# Patient Record
Sex: Female | Born: 1950 | Race: Black or African American | Hispanic: No | Smoking: Never smoker
Health system: Southern US, Community
[De-identification: ages and names within clinical notes are randomized; demographics above are authoritative.]

## PROBLEM LIST (undated history)

## (undated) DIAGNOSIS — I10 Essential (primary) hypertension: Secondary | ICD-10-CM

## (undated) DIAGNOSIS — E785 Hyperlipidemia, unspecified: Secondary | ICD-10-CM

## (undated) DIAGNOSIS — Z973 Presence of spectacles and contact lenses: Secondary | ICD-10-CM

## (undated) HISTORY — PX: COLONOSCOPY: SHX174

## (undated) HISTORY — PX: ABDOMINAL HYSTERECTOMY: SHX81

---

## 2005-07-23 ENCOUNTER — Emergency Department (HOSPITAL_COMMUNITY): Admission: EM | Admit: 2005-07-23 | Discharge: 2005-07-23 | Payer: Self-pay | Admitting: Emergency Medicine

## 2007-10-08 ENCOUNTER — Emergency Department (HOSPITAL_COMMUNITY): Admission: EM | Admit: 2007-10-08 | Discharge: 2007-10-09 | Payer: Self-pay | Admitting: Emergency Medicine

## 2010-11-27 ENCOUNTER — Emergency Department (HOSPITAL_BASED_OUTPATIENT_CLINIC_OR_DEPARTMENT_OTHER)
Admission: EM | Admit: 2010-11-27 | Discharge: 2010-11-27 | Payer: Self-pay | Source: Home / Self Care | Admitting: Emergency Medicine

## 2011-02-27 LAB — DIFFERENTIAL
Basophils Absolute: 0 10*3/uL (ref 0.0–0.1)
Basophils Relative: 0 % (ref 0–1)
Eosinophils Absolute: 0 10*3/uL (ref 0.0–0.7)
Monocytes Relative: 5 % (ref 3–12)
Neutro Abs: 8.1 10*3/uL — ABNORMAL HIGH (ref 1.7–7.7)
Neutrophils Relative %: 84 % — ABNORMAL HIGH (ref 43–77)

## 2011-02-27 LAB — LIPASE, BLOOD: Lipase: 75 U/L (ref 23–300)

## 2011-02-27 LAB — POCT CARDIAC MARKERS: Myoglobin, poc: 83.5 ng/mL (ref 12–200)

## 2011-02-27 LAB — COMPREHENSIVE METABOLIC PANEL
ALT: 17 U/L (ref 0–35)
Alkaline Phosphatase: 98 U/L (ref 39–117)
BUN: 11 mg/dL (ref 6–23)
CO2: 23 mEq/L (ref 19–32)
GFR calc non Af Amer: >60 mL/min (ref >60)
Glucose, Bld: 171 mg/dL — ABNORMAL HIGH (ref 70–99)
Potassium: 3.9 mEq/L (ref 3.5–5.1)
Total Bilirubin: 0.8 mg/dL (ref 0.3–1.2)
Total Protein: 8.1 g/dL (ref 6.0–8.3)

## 2011-02-27 LAB — CBC
HCT: 39.7 % (ref 36.0–46.0)
Hemoglobin: 14.1 g/dL (ref 12.0–15.0)
MCV: 88 fL (ref 78.0–100.0)
RDW: 13.1 % (ref 11.5–15.5)
WBC: 9.7 10*3/uL (ref 4.0–10.5)

## 2015-03-08 ENCOUNTER — Ambulatory Visit: Payer: Self-pay | Admitting: General Surgery

## 2015-03-31 ENCOUNTER — Encounter (HOSPITAL_BASED_OUTPATIENT_CLINIC_OR_DEPARTMENT_OTHER): Payer: Self-pay | Admitting: *Deleted

## 2015-03-31 NOTE — Progress Notes (Signed)
To come in for CCS cxr,ekg, cbc cmet

## 2015-04-04 ENCOUNTER — Other Ambulatory Visit: Payer: Self-pay | Admitting: *Deleted

## 2015-04-04 ENCOUNTER — Ambulatory Visit
Admission: RE | Admit: 2015-04-04 | Discharge: 2015-04-04 | Disposition: A | Discharge status: Home / Self Care | Payer: BLUE CROSS/BLUE SHIELD | Source: Ambulatory Visit | Attending: General Surgery | Admitting: General Surgery

## 2015-04-04 ENCOUNTER — Other Ambulatory Visit: Payer: Self-pay

## 2015-04-04 ENCOUNTER — Encounter (HOSPITAL_BASED_OUTPATIENT_CLINIC_OR_DEPARTMENT_OTHER)
Admission: RE | Admit: 2015-04-04 | Discharge: 2015-04-04 | Disposition: A | Discharge status: Home / Self Care | Payer: BLUE CROSS/BLUE SHIELD | Source: Ambulatory Visit | Attending: General Surgery | Admitting: General Surgery

## 2015-04-04 DIAGNOSIS — K219 Gastro-esophageal reflux disease without esophagitis: Secondary | ICD-10-CM | POA: Diagnosis not present

## 2015-04-04 DIAGNOSIS — Z01818 Encounter for other preprocedural examination: Secondary | ICD-10-CM | POA: Insufficient documentation

## 2015-04-04 DIAGNOSIS — I10 Essential (primary) hypertension: Secondary | ICD-10-CM | POA: Diagnosis not present

## 2015-04-04 DIAGNOSIS — K8020 Calculus of gallbladder without cholecystitis without obstruction: Secondary | ICD-10-CM | POA: Insufficient documentation

## 2015-04-04 DIAGNOSIS — K8010 Calculus of gallbladder with chronic cholecystitis without obstruction: Secondary | ICD-10-CM | POA: Diagnosis not present

## 2015-04-04 LAB — COMPREHENSIVE METABOLIC PANEL
ALBUMIN: 3.8 g/dL (ref 3.5–5.2)
ALT: 12 U/L (ref 0–35)
AST: 20 U/L (ref 0–37)
Alkaline Phosphatase: 66 U/L (ref 39–117)
Anion gap: 10 (ref 5–15)
BILIRUBIN TOTAL: 0.9 mg/dL (ref 0.3–1.2)
BUN: 7 mg/dL (ref 6–23)
CHLORIDE: 100 mmol/L (ref 96–112)
CO2: 28 mmol/L (ref 19–32)
CREATININE: 0.95 mg/dL (ref 0.50–1.10)
Calcium: 9.1 mg/dL (ref 8.4–10.5)
GFR calc Af Amer: 72 mL/min — ABNORMAL LOW (ref >90)
GFR calc non Af Amer: 62 mL/min — ABNORMAL LOW (ref >90)
Glucose, Bld: 152 mg/dL — ABNORMAL HIGH (ref 70–99)
POTASSIUM: 4.2 mmol/L (ref 3.5–5.1)
SODIUM: 138 mmol/L (ref 135–145)
Total Protein: 7.2 g/dL (ref 6.0–8.3)

## 2015-04-04 LAB — CBC WITH DIFFERENTIAL/PLATELET
Basophils Absolute: 0 10*3/uL (ref 0.0–0.1)
Basophils Relative: 0 % (ref 0–1)
EOS ABS: 0.1 10*3/uL (ref 0.0–0.7)
Eosinophils Relative: 2 % (ref 0–5)
HCT: 42 % (ref 36.0–46.0)
Hemoglobin: 14.3 g/dL (ref 12.0–15.0)
LYMPHS ABS: 2 10*3/uL (ref 0.7–4.0)
Lymphocytes Relative: 29 % (ref 12–46)
MCH: 32.4 pg (ref 26.0–34.0)
MCHC: 34 g/dL (ref 30.0–36.0)
MCV: 95 fL (ref 78.0–100.0)
MONOS PCT: 13 % — AB (ref 3–12)
Monocytes Absolute: 0.9 10*3/uL (ref 0.1–1.0)
NEUTROS PCT: 56 % (ref 43–77)
Neutro Abs: 3.7 10*3/uL (ref 1.7–7.7)
Platelets: 387 10*3/uL (ref 150–400)
RBC: 4.42 MIL/uL (ref 3.87–5.11)
RDW: 13.7 % (ref 11.5–15.5)
WBC: 6.7 10*3/uL (ref 4.0–10.5)

## 2015-04-04 NOTE — Progress Notes (Signed)
Pt here for labs/EKG. Reviewed EKG with Dr. Ivin Bootyrews. Pt forgot her list of medicines she had been asked to bring when she came for labs. Reiterated importance of bringing meds with her on DOS for review. Sent pt for CXR as ordered.

## 2015-04-06 NOTE — H&P (Signed)
Judy Castro. Amedee 03/08/2015 8:43 AM Location: [facility] Patient #: [id number] DOB: 12/14/1951 Divorced / Language: [place] / Race: Black or African American Female  History of Present Illness Judy Castro CMA; 03/08/2015 8:44 AM) Patient words: gallbladder.  The patient is a 64 year old female    Other Problems Judy Castro, CMA; 03/08/2015 8:43 AM) Gastroesophageal Reflux Disease High blood pressure  Past Surgical History Judy Castro, CMA; 03/08/2015 8:43 AM) Hysterectomy (not due to cancer) - Partial  Diagnostic Studies History Judy Castro, CMA; 03/08/2015 8:43 AM) Colonoscopy >10 years ago Mammogram within last year Pap Smear 1-5 years ago  Allergies Judy Castro, CMA; 03/08/2015 8:44 AM) No Known Drug Allergies03/22/2016  Medication History (Judy Castro, CMA; 03/08/2015 8:45 AM) Lisinopril (  Tablet, Oral) Active. Atenolol (  Tablet, Oral) Active. Diclofenac Potassium (  Tablet, Oral) Active. Estradiol (0.05MG /24HR Patch TW, Transdermal) Active. Simvastatin (  Tablet, Oral) Active. Medications Reconciled  Social History Judy Castro, CMA; 03/08/2015 8:43 AM) Caffeine use Tea. No alcohol use No drug use Tobacco use Never smoker.  Family History Judy Castro, CMA; 03/08/2015 8:43 AM) Alcohol Abuse Father. Arthritis Father. Diabetes Mellitus Mother, Sister. Heart Disease Brother, Mother, Sister. Heart disease in female family member before age 16 Hypertension Brother, Father, Mother, Sister. Respiratory Condition Brother, Mother.  Pregnancy / Birth History Judy Castro, CMA; 03/08/2015 8:43 AM) Age at menarche 12 years. Gravida 3 Maternal age 63-25 Para 3  Review of Systems Judy Castro. Judy Spruce MD; 03/08/2015 9:24 AM) General Present- Night Sweats and Weight Gain. Not Present- Appetite Loss, Chills, Fatigue, Fever and Weight Loss. Skin Not Present- Change in Wart/Mole, Dryness, Hives, Jaundice, New Lesions, Non-Healing  Wounds, Rash and Ulcer. HEENT Present- Wears glasses/contact lenses. Not Present- Earache, Hearing Loss, Hoarseness, Nose Bleed, Oral Ulcers, Ringing in the Ears, Seasonal Allergies, Sinus Pain, Sore Throat, Visual Disturbances and Yellow Eyes. Respiratory Present- Snoring. Not Present- Bloody sputum, Chronic Cough, Difficulty Breathing and Wheezing. Breast Not Present- Breast Mass, Breast Pain, Nipple Discharge and Skin Changes. Gastrointestinal Present- Bloating, Excessive gas, Gas, Heartburn and Indigestion. Not Present- Abdominal Pain, Bloody Stool, Change in Bowel Habits, Chronic diarrhea, Constipation, Difficulty Swallowing, Gets full quickly at meals, Hemorrhoids, Nausea, Rectal Pain and Vomiting. Female Genitourinary Present- Urgency. Not Present- Frequency, Nocturia, Painful Urination and Pelvic Pain. Musculoskeletal Present- Joint Stiffness. Not Present- Back Pain, Joint Pain, Muscle Pain, Muscle Weakness and Swelling of Extremities. Neurological Not Present- Decreased Memory, Fainting, Headaches, Numbness, Seizures, Tingling, Tremor, Trouble walking and Weakness. Psychiatric Not Present- Anxiety, Bipolar, Change in Sleep Pattern, Depression, Fearful and Frequent crying. Endocrine Present- Hot flashes. Not Present- Cold Intolerance, Excessive Hunger, Hair Changes, Heat Intolerance and New Diabetes. Hematology Not Present- Easy Bruising, Excessive bleeding, Gland problems, HIV and Persistent Infections.   Vitals (Judy Castro CMA; 03/08/2015 8:44 AM) 03/08/2015 8:44 AM Weight: 212 lb Height: 63in Body Surface Area: 2.07 m Body Mass Index: 37.55 kg/m Temp.: 97.48F(Temporal)  Pulse: 78 (Regular)  BP: 124/76 (Sitting, Left Arm, Standard)    Physical Exam (Judy Castro O. Judy Spruce MD; 03/08/2015 9:26 AM) General Mental Status-Alert. General Appearance-Well groomed and Consistent with stated age.  Chest and Lung Exam Chest and lung exam reveals -normal excursion with symmetric  chest walls, quiet, even and easy respiratory effort with no use of accessory muscles, non-tender and normal tactile fremitus and on auscultation, normal breath sounds, no adventitious sounds and normal vocal resonance.  Cardiovascular Cardiovascular examination reveals -on palpation PMI is normal in location and amplitude, no palpable S3 or S4. Normal cardiac borders. and (  see Vital Signs section for blood pressure measurements). Auscultation Rhythm - Regular. Heart Sounds - Normal heart sounds.  Abdomen Inspection Inspection of the abdomen reveals - No Visible peristalsis. Palpation/Percussion Tenderness - Epigastrium and Right Upper Quadrant. Note: Mild discomfort. Liver - Upper border - 4th intercostal space. Auscultation Auscultation of the abdomen reveals - Bowel sounds normal.    Assessment & Plan Judy Castro O. Judy Reinheimer MD; 03/08/2015 9:28 AM) SYMPTOMATIC CHOLELITHIASIS (574.20  K80.20) Story: Patient with recurrrent symptoms over the past two years. recent episode 3 weeks ago, ultrasound demonstrated cholelithiasis Impression: Symptomatic cholelithiasis, biliary colic. Will schedule for surgery based on the patient's findings clinically and radiologically.     Signed by Judy RidgesJames O Tanayah Squitieri, MD (03/08/2015 9:30 AM)

## 2015-04-07 ENCOUNTER — Ambulatory Visit (HOSPITAL_BASED_OUTPATIENT_CLINIC_OR_DEPARTMENT_OTHER): Payer: BLUE CROSS/BLUE SHIELD | Admitting: Certified Registered"

## 2015-04-07 ENCOUNTER — Encounter (HOSPITAL_BASED_OUTPATIENT_CLINIC_OR_DEPARTMENT_OTHER): Payer: Self-pay | Admitting: *Deleted

## 2015-04-07 ENCOUNTER — Ambulatory Visit (HOSPITAL_BASED_OUTPATIENT_CLINIC_OR_DEPARTMENT_OTHER)
Admission: RE | Admit: 2015-04-07 | Discharge: 2015-04-08 | Disposition: A | Discharge status: Home / Self Care | Payer: BLUE CROSS/BLUE SHIELD | Source: Ambulatory Visit | Attending: General Surgery | Admitting: General Surgery

## 2015-04-07 ENCOUNTER — Encounter (HOSPITAL_BASED_OUTPATIENT_CLINIC_OR_DEPARTMENT_OTHER)
Admission: RE | Disposition: A | Discharge status: Home / Self Care | Payer: Self-pay | Source: Ambulatory Visit | Attending: General Surgery

## 2015-04-07 ENCOUNTER — Ambulatory Visit (HOSPITAL_COMMUNITY): Payer: BLUE CROSS/BLUE SHIELD

## 2015-04-07 DIAGNOSIS — K8010 Calculus of gallbladder with chronic cholecystitis without obstruction: Secondary | ICD-10-CM | POA: Insufficient documentation

## 2015-04-07 DIAGNOSIS — I10 Essential (primary) hypertension: Secondary | ICD-10-CM | POA: Insufficient documentation

## 2015-04-07 DIAGNOSIS — K8063 Calculus of gallbladder and bile duct with acute cholecystitis with obstruction: Secondary | ICD-10-CM

## 2015-04-07 DIAGNOSIS — K219 Gastro-esophageal reflux disease without esophagitis: Secondary | ICD-10-CM | POA: Insufficient documentation

## 2015-04-07 DIAGNOSIS — Z9049 Acquired absence of other specified parts of digestive tract: Secondary | ICD-10-CM

## 2015-04-07 HISTORY — DX: Essential (primary) hypertension: I10

## 2015-04-07 HISTORY — PX: CHOLECYSTECTOMY: SHX55

## 2015-04-07 HISTORY — DX: Hyperlipidemia, unspecified: E78.5

## 2015-04-07 HISTORY — DX: Presence of spectacles and contact lenses: Z97.3

## 2015-04-07 LAB — GLUCOSE, CAPILLARY: Glucose-Capillary: 113 mg/dL — ABNORMAL HIGH (ref 70–99)

## 2015-04-07 SURGERY — LAPAROSCOPIC CHOLECYSTECTOMY WITH INTRAOPERATIVE CHOLANGIOGRAM
Anesthesia: General | Site: Abdomen

## 2015-04-07 MED ORDER — CHLORHEXIDINE GLUCONATE 4 % EX LIQD: 1.0000 "application " | Freq: Once | CUTANEOUS | Status: DC

## 2015-04-07 MED ORDER — HYDROMORPHONE HCL 1 MG/ML IJ SOLN: 0.5000 mg | INTRAMUSCULAR | Status: DC | PRN

## 2015-04-07 MED ORDER — HYDROMORPHONE HCL 1 MG/ML IJ SOLN
INTRAMUSCULAR | Status: AC
  Filled 2015-04-07: qty 1

## 2015-04-07 MED ORDER — ESTRADIOL 1 MG PO TABS: 1.0000 mg | ORAL_TABLET | Freq: Every day | ORAL | Status: DC

## 2015-04-07 MED ORDER — HYDROMORPHONE HCL 1 MG/ML IJ SOLN
0.2500 mg | INTRAMUSCULAR | Status: DC | PRN
  Administered 2015-04-07 (×4): 0.5 mg via INTRAVENOUS

## 2015-04-07 MED ORDER — ATENOLOL 25 MG PO TABS: 25.0000 mg | ORAL_TABLET | Freq: Every day | ORAL | Status: DC

## 2015-04-07 MED ORDER — IBUPROFEN 200 MG PO TABS: 200.0000 mg | ORAL_TABLET | Freq: Four times a day (QID) | ORAL | Status: DC | PRN

## 2015-04-07 MED ORDER — SCOPOLAMINE 1 MG/3DAYS TD PT72
1.0000 | MEDICATED_PATCH | TRANSDERMAL | Status: DC
  Administered 2015-04-07: 1.5 mg via TRANSDERMAL
  Administered 2015-04-07: 1 via TRANSDERMAL

## 2015-04-07 MED ORDER — FENTANYL CITRATE 0.05 MG/ML IJ SOLN
50.0000 ug | INTRAMUSCULAR | Status: DC | PRN
  Administered 2015-04-07: 50 ug via INTRAVENOUS
  Administered 2015-04-07: 100 ug via INTRAVENOUS

## 2015-04-07 MED ORDER — PROPOFOL 10 MG/ML IV BOLUS
INTRAVENOUS | Status: DC | PRN
  Administered 2015-04-07: 200 mg via INTRAVENOUS

## 2015-04-07 MED ORDER — LISINOPRIL 20 MG PO TABS: 20.0000 mg | ORAL_TABLET | Freq: Every day | ORAL | Status: DC

## 2015-04-07 MED ORDER — OXYCODONE HCL 5 MG PO TABS: 5.0000 mg | ORAL_TABLET | Freq: Once | ORAL | Status: DC | PRN

## 2015-04-07 MED ORDER — DEXAMETHASONE SODIUM PHOSPHATE 4 MG/ML IJ SOLN
INTRAMUSCULAR | Status: DC | PRN
  Administered 2015-04-07: 10 mg via INTRAVENOUS

## 2015-04-07 MED ORDER — LIDOCAINE HCL (CARDIAC) 20 MG/ML IV SOLN
INTRAVENOUS | Status: DC | PRN
  Administered 2015-04-07: 50 mg via INTRAVENOUS

## 2015-04-07 MED ORDER — KETOROLAC TROMETHAMINE 30 MG/ML IJ SOLN: 30.0000 mg | Freq: Once | INTRAMUSCULAR | Status: DC | PRN

## 2015-04-07 MED ORDER — BUPIVACAINE-EPINEPHRINE 0.5% -1:200000 IJ SOLN
INTRAMUSCULAR | Status: DC | PRN
  Administered 2015-04-07: 10 mL

## 2015-04-07 MED ORDER — IBUPROFEN 100 MG/5ML PO SUSP: 200.0000 mg | Freq: Four times a day (QID) | ORAL | Status: DC | PRN

## 2015-04-07 MED ORDER — NEOSTIGMINE METHYLSULFATE 10 MG/10ML IV SOLN
INTRAVENOUS | Status: DC | PRN
  Administered 2015-04-07: 3 mg via INTRAVENOUS

## 2015-04-07 MED ORDER — OXYCODONE-ACETAMINOPHEN 10-325 MG PO TABS: 1.0000 | ORAL_TABLET | ORAL | Status: AC | PRN

## 2015-04-07 MED ORDER — PROPOFOL 500 MG/50ML IV EMUL
INTRAVENOUS | Status: AC
Start: 2015-04-07 — End: 2015-04-07
  Filled 2015-04-07: qty 50

## 2015-04-07 MED ORDER — DEXTROSE 5 % IV SOLN
2.0000 g | INTRAVENOUS | Status: AC
  Administered 2015-04-07: 2 g via INTRAVENOUS

## 2015-04-07 MED ORDER — SCOPOLAMINE 1 MG/3DAYS TD PT72
MEDICATED_PATCH | TRANSDERMAL | Status: AC
  Filled 2015-04-07: qty 1

## 2015-04-07 MED ORDER — MIDAZOLAM HCL 2 MG/2ML IJ SOLN
1.0000 mg | INTRAMUSCULAR | Status: DC | PRN
  Administered 2015-04-07: 2 mg via INTRAVENOUS

## 2015-04-07 MED ORDER — MEPERIDINE HCL 25 MG/ML IJ SOLN: 6.2500 mg | INTRAMUSCULAR | Status: DC | PRN

## 2015-04-07 MED ORDER — FENTANYL CITRATE (PF) 100 MCG/2ML IJ SOLN
INTRAMUSCULAR | Status: AC
  Filled 2015-04-07: qty 4

## 2015-04-07 MED ORDER — OXYCODONE HCL 5 MG/5ML PO SOLN: 5.0000 mg | Freq: Once | ORAL | Status: DC | PRN

## 2015-04-07 MED ORDER — MIDAZOLAM HCL 2 MG/2ML IJ SOLN
INTRAMUSCULAR | Status: AC
  Filled 2015-04-07: qty 2

## 2015-04-07 MED ORDER — KCL IN DEXTROSE-NACL 10-5-0.45 MEQ/L-%-% IV SOLN
INTRAVENOUS | Status: DC
  Administered 2015-04-07: 18:00:00 via INTRAVENOUS

## 2015-04-07 MED ORDER — ROCURONIUM BROMIDE 100 MG/10ML IV SOLN
INTRAVENOUS | Status: DC | PRN
  Administered 2015-04-07: 5 mg via INTRAVENOUS
  Administered 2015-04-07: 35 mg via INTRAVENOUS

## 2015-04-07 MED ORDER — GLYCOPYRROLATE 0.2 MG/ML IJ SOLN
INTRAMUSCULAR | Status: DC | PRN
  Administered 2015-04-07: 0.4 mg via INTRAVENOUS

## 2015-04-07 MED ORDER — OXYCODONE-ACETAMINOPHEN 5-325 MG PO TABS
1.0000 | ORAL_TABLET | ORAL | Status: DC | PRN
  Administered 2015-04-07 – 2015-04-08 (×2): 2 via ORAL
  Filled 2015-04-07 (×2): qty 2

## 2015-04-07 MED ORDER — HYDROMORPHONE HCL 1 MG/ML IJ SOLN
0.5000 mg | INTRAMUSCULAR | Status: DC | PRN
  Administered 2015-04-07 (×2): 0.5 mg via INTRAVENOUS

## 2015-04-07 MED ORDER — FENTANYL CITRATE (PF) 100 MCG/2ML IJ SOLN
INTRAMUSCULAR | Status: AC
Start: 2015-04-07 — End: 2015-04-07
  Filled 2015-04-07: qty 2

## 2015-04-07 MED ORDER — SODIUM CHLORIDE 0.9 % IV SOLN
INTRAVENOUS | Status: DC | PRN
  Administered 2015-04-07: 12 mL

## 2015-04-07 MED ORDER — SODIUM CHLORIDE 0.9 % IR SOLN
Status: DC | PRN
  Administered 2015-04-07: 2400 mL

## 2015-04-07 MED ORDER — LACTATED RINGERS IV SOLN
INTRAVENOUS | Status: DC
  Administered 2015-04-07 (×3): via INTRAVENOUS

## 2015-04-07 MED ORDER — DEXTROSE 5 % IV SOLN
INTRAVENOUS | Status: AC
  Filled 2015-04-07 (×2): qty 1

## 2015-04-07 SURGICAL SUPPLY — 55 items
APPLIER CLIP 5 13 M/L LIGAMAX5 (MISCELLANEOUS) ×3
APPLIER CLIP ROT 10 11.4 M/L (STAPLE)
APR CLP MED LRG 5 ANG JAW (MISCELLANEOUS) ×1
BLADE CLIPPER SURG (BLADE) IMPLANT
CHLORAPREP W/TINT 26ML (MISCELLANEOUS) ×3 IMPLANT
CLEANER CAUTERY TIP 5X5 PAD (MISCELLANEOUS) ×1 IMPLANT
CLIP APPLIE 5 13 M/L LIGAMAX5 (MISCELLANEOUS) ×1 IMPLANT
CLIP APPLIE ROT 10 11.4 M/L (STAPLE) IMPLANT
CLOSURE WOUND 1/2 X4 (GAUZE/BANDAGES/DRESSINGS)
COVER MAYO STAND STRL (DRAPES) ×6 IMPLANT
DECANTER SPIKE VIAL GLASS SM (MISCELLANEOUS) IMPLANT
DRAPE C-ARM 42X72 X-RAY (DRAPES) ×3 IMPLANT
DRAPE LAPAROSCOPIC ABDOMINAL (DRAPES) ×3 IMPLANT
DRSG TEGADERM 2-3/8X2-3/4 SM (GAUZE/BANDAGES/DRESSINGS) ×12 IMPLANT
ELECT REM PT RETURN 9FT ADLT (ELECTROSURGICAL) ×3
ELECTRODE REM PT RTRN 9FT ADLT (ELECTROSURGICAL) ×1 IMPLANT
ENDOLOOP SUT PDS II  0 18 (SUTURE) ×2
ENDOLOOP SUT PDS II 0 18 (SUTURE) ×1 IMPLANT
FILTER SMOKE EVAC LAPAROSHD (FILTER) ×3 IMPLANT
GLOVE BIO SURGEON STRL SZ7 (GLOVE) ×3 IMPLANT
GLOVE BIO SURGEON STRL SZ7.5 (GLOVE) ×3 IMPLANT
GLOVE BIOGEL PI IND STRL 7.0 (GLOVE) ×1 IMPLANT
GLOVE BIOGEL PI IND STRL 8 (GLOVE) ×2 IMPLANT
GLOVE BIOGEL PI INDICATOR 7.0 (GLOVE) ×2
GLOVE BIOGEL PI INDICATOR 8 (GLOVE) ×4
GLOVE ECLIPSE 7.5 STRL STRAW (GLOVE) ×3 IMPLANT
GLOVE EXAM NITRILE EXT CUFF MD (GLOVE) ×3 IMPLANT
GLOVE SURG SS PI 7.0 STRL IVOR (GLOVE) ×3 IMPLANT
GOWN STRL REUS W/ TWL LRG LVL3 (GOWN DISPOSABLE) ×3 IMPLANT
GOWN STRL REUS W/ TWL XL LVL3 (GOWN DISPOSABLE) ×1 IMPLANT
GOWN STRL REUS W/TWL LRG LVL3 (GOWN DISPOSABLE) ×6
GOWN STRL REUS W/TWL XL LVL3 (GOWN DISPOSABLE) ×2
HEMOSTAT SNOW SURGICEL 2X4 (HEMOSTASIS) IMPLANT
LIQUID BAND (GAUZE/BANDAGES/DRESSINGS) ×3 IMPLANT
NS IRRIG 1000ML POUR BTL (IV SOLUTION) IMPLANT
PACK BASIN DAY SURGERY FS (CUSTOM PROCEDURE TRAY) ×3 IMPLANT
PAD CLEANER CAUTERY TIP 5X5 (MISCELLANEOUS) ×2
POUCH SPECIMEN RETRIEVAL 10MM (ENDOMECHANICALS) ×3 IMPLANT
SCISSORS LAP 5X35 DISP (ENDOMECHANICALS) IMPLANT
SET CHOLANGIOGRAPH 5 50 .035 (SET/KITS/TRAYS/PACK) ×3 IMPLANT
SET IRRIG TUBING LAPAROSCOPIC (IRRIGATION / IRRIGATOR) ×3 IMPLANT
SLEEVE ENDOPATH XCEL 5M (ENDOMECHANICALS) ×6 IMPLANT
SLEEVE SCD COMPRESS KNEE MED (MISCELLANEOUS) ×3 IMPLANT
SPECIMEN JAR SMALL (MISCELLANEOUS) ×3 IMPLANT
STRIP CLOSURE SKIN 1/2X4 (GAUZE/BANDAGES/DRESSINGS) IMPLANT
SUT MNCRL AB 4-0 PS2 18 (SUTURE) ×3 IMPLANT
SUT VICRYL 0 UR6 27IN ABS (SUTURE) ×3 IMPLANT
TOWEL OR 17X24 6PK STRL BLUE (TOWEL DISPOSABLE) ×3 IMPLANT
TRAY LAPAROSCOPIC (CUSTOM PROCEDURE TRAY) ×3 IMPLANT
TROCAR XCEL BLUNT TIP 100MML (ENDOMECHANICALS) ×3 IMPLANT
TROCAR XCEL NON-BLD 11X100MML (ENDOMECHANICALS) IMPLANT
TROCAR XCEL NON-BLD 5MMX100MML (ENDOMECHANICALS) ×3 IMPLANT
TUBE CONNECTING 20'X1/4 (TUBING)
TUBE CONNECTING 20X1/4 (TUBING) IMPLANT
TUBING INSUFFLATION (TUBING) ×3 IMPLANT

## 2015-04-07 NOTE — Anesthesia Preprocedure Evaluation (Signed)
Anesthesia Evaluation  Patient identified by MRN, date of birth, ID band Patient awake    Reviewed: Allergy & Precautions, NPO status , Patient's Chart, lab work & pertinent test results  Airway Mallampati: I  TM Distance: >3 FB Neck ROM: Full    Dental  (+) Teeth Intact, Dental Advisory Given   Pulmonary  breath sounds clear to auscultation        Cardiovascular hypertension, Pt. on medications and Pt. on home beta blockers Rhythm:Regular Rate:Normal     Neuro/Psych    GI/Hepatic   Endo/Other    Renal/GU      Musculoskeletal   Abdominal   Peds  Hematology   Anesthesia Other Findings   Reproductive/Obstetrics                             Anesthesia Physical Anesthesia Plan  ASA: II  Anesthesia Plan: General   Post-op Pain Management:    Induction: Intravenous  Airway Management Planned: LMA  Additional Equipment:   Intra-op Plan:   Post-operative Plan: Extubation in OR  Informed Consent: I have reviewed the patients History and Physical, chart, labs and discussed the procedure including the risks, benefits and alternatives for the proposed anesthesia with the patient or authorized representative who has indicated his/her understanding and acceptance.   Dental advisory given  Plan Discussed with: CRNA, Anesthesiologist and Surgeon  Anesthesia Plan Comments:         Anesthesia Quick Evaluation  

## 2015-04-07 NOTE — Interval H&P Note (Signed)
History and Physical Interval Note: The patient has continued to have symptoms.  Ready for OR. 04/07/2015 10:16 AM  Lidia CollumJanice M Judson  has presented today for surgery, with the diagnosis of Symptomatic cholelithiasis  The various methods of treatment have been discussed with the patient and family. After consideration of risks, benefits and other options for treatment, the patient has consented to  Procedure(s): LAPAROSCOPIC CHOLECYSTECTOMY WITH INTRAOPERATIVE CHOLANGIOGRAM (N/A) as a surgical intervention .  The patient's history has been reviewed, patient examined, no change in status, stable for surgery.  I have reviewed the patient's chart and labs.  Questions were answered to the patient's satisfaction.     Avaleigh Decuir, JAY

## 2015-04-07 NOTE — Anesthesia Postprocedure Evaluation (Signed)
Anesthesia Post Note  Patient: Judy CollumJanice M Platten  Procedure(s) Performed: Procedure(s) (LRB): LAPAROSCOPIC CHOLECYSTECTOMY WITH INTRAOPERATIVE CHOLANGIOGRAM (N/A)  Anesthesia type: general  Patient location: PACU  Post pain: Pain level controlled  Post assessment: Patient's Cardiovascular Status Stable  Last Vitals:  Filed Vitals:   04/07/15 1615  BP: 131/66  Pulse: 75  Temp:   Resp: 16    Post vital signs: Reviewed and stable  Level of consciousness: sedated  Complications: No apparent anesthesia complications

## 2015-04-07 NOTE — Op Note (Signed)
OPERATIVE REPORT  DATE OF OPERATION: 04/07/2015  PATIENT:  Judy Castro  64 y.o. female  PRE-OPERATIVE DIAGNOSIS:  Symptomatic cholelithiasis  POST-OPERATIVE DIAGNOSIS:  Symptomatic cholelithiasis  PROCEDURE:  Procedure(s): LAPAROSCOPIC CHOLECYSTECTOMY WITH INTRAOPERATIVE CHOLANGIOGRAM  SURGEON:  Surgeon(s): Frederik SchmidtJay Sakina Briones, MD Violeta GelinasBurke Thompson, MD  ASSISTANT: Janee Mornhompson  ANESTHESIA:   general  EBL: 150 ml  BLOOD ADMINISTERED: none  DRAINS: none   SPECIMEN:  Source of Specimen:  Gallbladder and contents  COUNTS CORRECT:  YES  PROCEDURE DETAILS: The patient was taken to the operating room and placed on the table in the supine position.  After an adequate endotracheal anesthetic was administered, the patient was prepped with ChloroPrep, and then draped in the usual manner exposing the entire abdomen laterally, inferiorly and up  to the costal margins.  After a proper timeout was performed including identifying the patient and the procedure to be performed, a supraumbilical 1.5cm midline incision was made using a #15 blade.  This was taken down to the fascia which was then incised with a #15 blade.  The edges of the fascia were tented up with Kocher clamps as the preperitoneal space was penetrated with a Kelly clamp into the peritoneum.  Once this was done, a pursestring suture of 0 Vicryl was passed around the fascial opening.  This was subsequently used to secure the [facility] which was passed into the peritoneal cavity.  Once the [facility] cannula was in place, carbon dioxide gas was insufflated into the peritoneal cavity up to a maximal intra-abdominal pressure of 15mm Hg.The laparoscope, with attached camera and light source, was passed into the peritoneal cavity to visualize the direct insertion of two right upper quadrant 5mm cannulas, and a sup-xiphoid 5mm cannula.  Once all cannulas were in place, the dissection was begun.  The patient had significant inflammatory adhesions to  the body and infundibulum of the gallbladder which is markedly distended. A Njzat cannula was necessary to aspirate the gallbladder prior to being able to grab it for dissection. Even with that the wall was markedly thickened and it was difficult to retract.  The anatomy was not normal. Ultimately it was discovered that the patient had a very shortened cystic duct and the common bile duct which was drawn into the gallbladder wall. Because of this after a failed attempt to performed a cholangiogram, the distal infundibulum and proximal cystic duct had to be ligated with an Endoloop. Attempt to performed a cholangiogram with a cholangiocatheter was failed by leakage from the catheter at the duct.  The dissection from the gallbladder bed was not very difficult and actually we dissected the gallbladder from the top down in order to get to the area where we could clearly see the very short cystic duct entering into the sidewall of the common bile duct. No drains were needed as there was no leakage. There was no spillage of bile but a few very small nonpigmented gallstones were retrieved from the peritoneal cavity.  Once the gallbladder was removed, the bed was inspected for hemostasis.  Once excellent hemostasis was obtained all gas and fluids were aspirated from above the liver, then the cannulas were removed.  The supraumbilical incision was closed using the pursestring suture which was in place.  A second figure-of-eight stitch of 0 Vicryl passed on a UR 6 needle was used to help close the initial entrance site.  0.50% bupivicaine with epinephrine was injected at all sites.  All 10mm or greater cannula sites were close using a running  subcuticular stitch of 4-0 Monocryl.  5.64mm cannula sites were closed with Dermabond only.Steri-Strips and Tagaderm were used to complete the dressings at all sites.  At this point all needle, sponge, and instrument counts were correct.The patient was awakened from anesthesia and  taken to the PACU in stable condition.    V-shaped CBD/cystic jxn  PATIENT DISPOSITION:  PACU - hemodynamically stable.   Frederik Schmidt 4/21/20161:46 PM

## 2015-04-07 NOTE — Anesthesia Procedure Notes (Signed)
Procedure Name: Intubation Date/Time: 04/07/2015 10:56 AM Performed by: Zenia ResidesPAYNE, Shuntae Herzig D Pre-anesthesia Checklist: Patient identified, Emergency Drugs available, Suction available and Patient being monitored Patient Re-evaluated:Patient Re-evaluated prior to inductionOxygen Delivery Method: Circle System Utilized Preoxygenation: Pre-oxygenation with 100% oxygen Intubation Type: IV induction Ventilation: Mask ventilation without difficulty Laryngoscope Size: Mac and 3 Grade View: Grade I Tube type: Oral Number of attempts: 1 Airway Equipment and Method: Stylet and Oral airway Placement Confirmation: ETT inserted through vocal cords under direct vision,  positive ETCO2 and breath sounds checked- equal and bilateral Secured at: 22 cm Tube secured with: Tape Dental Injury: Teeth and Oropharynx as per pre-operative assessment

## 2015-04-07 NOTE — Transfer of Care (Signed)
Immediate Anesthesia Transfer of Care Note  Patient: Judy CollumJanice M Deguia  Procedure(s) Performed: Procedure(s): LAPAROSCOPIC CHOLECYSTECTOMY WITH INTRAOPERATIVE CHOLANGIOGRAM (N/A)  Patient Location: PACU  Anesthesia Type:General  Level of Consciousness: sedated  Airway & Oxygen Therapy: Patient Spontanous Breathing and Patient connected to face mask oxygen  Post-op Assessment: Report given to RN and Post -op Vital signs reviewed and stable  Post vital signs: Reviewed and stable  Last Vitals:  Filed Vitals:   04/07/15 0730  BP: 157/86  Pulse: 67  Temp: 36.6 C  Resp: 18    Complications: No apparent anesthesia complications

## 2015-04-07 NOTE — Discharge Instructions (Signed)
Cholecystostomy  The gallbladder is a pear-shaped organ that lies beneath the liver on the right side of the body. The gallbladder stores bile, a fluid that helps the body digest fats. However, sometimes bile and other fluids build up in the gallbladder because of an obstruction (for example, a gallstones). This can cause fever, pain, swelling, nausea and other serious symptoms. The procedure used to drain these fluids is called a cholecystostomy. A tube is inserted into the gallbladder. Fluid drains through the tube into a plastic bag outside the body. This procedure is usually done on people who are admitted to the hospital. The procedure is often recommended for people who cannot have gallbladder surgery right away, usually because they are too ill to make it through surgery. The cholecystostomy tube is usually temporary, until surgery can be done. RISKS AND COMPLICATIONS Although rare, complications can include:  Clogging of the tube.  Infection in or around the drain site. Antibiotics might be prescribed for the infection. Or, another tube might be inserted to drain the infected fluid.  Internal bleeding from the liver. BEFORE THE PROCEDURE   Try to quit smoking several weeks before the procedure. Smoking can slow healing.  Arrange for someone to drive you home from the hospital.  Right before your procedure, avoid all foods and liquids after midnight. This includes coffee, tea and water.  On the day of the procedure, arrive early to fill out all the paperwork. PROCEDURE You will be given a sedative to make you sleepy and a local anesthetic to numb the skin. Next, a small cut is made in the abdomen. Then a tube is threaded through the cut into the gallbladder. The procedure is usually done with ultrasound to guide the tube into the gallbladder. Once the tube is in place, the drain is secured to the skin with a stitch. The tube is then connected to a drainage bag.  AFTER THE PROCEDURE    People who have a cholecystostomy usually stay in the hospital for several days because they are so ill. You might not be able to eat for the first few days. Instead, you will be connected to an IV for fluids and nutrients.  The procedure does not cure the blockage that caused the fluid to build up in the first place. Because of this, the gallbladder will need to be removed in the future. The drain is removed at that time. HOME CARE INSTRUCTIONS  Be sure to follow your healthcare provider's instructions carefully. You may shower but avoid tub baths and swimming until your caregiver says it is OK. Eat and drink according to the directions you have been given. And be sure to make all follow-up appointments.  Call your healthcare provider if you notice new pain, redness or swelling around the wound. SEEK IMMEDIATE MEDICAL CARE IF:   There is increased abdominal pain.  Nausea or vomiting occurs.  You develop a fever.  The drainage tube comes out of the abdomen. Document Released: 03/01/2009 Document Revised: 02/25/2012 Document Reviewed: 03/01/2009 [facility] CenterExitCare Patient Information 2015 [place]. This information is not intended to replace advice given to you by your health care provider. Make sure you discuss any questions you have with your health care provider.    Post Anesthesia Home Care Instructions  Activity: Get plenty of rest for the remainder of the day. A responsible adult should stay with you for 24 hours following the procedure.  For the next 24 hours, DO NOT: -Drive a car Environmental consultant-Operate machinery -Drink  alcoholic beverages -Take any medication unless instructed by your physician -Make any legal decisions or sign important papers.  Meals: Start with liquid foods such as gelatin or soup. Progress to regular foods as tolerated. Avoid greasy, spicy, heavy foods. If nausea and/or vomiting occur, drink only clear liquids until the nausea and/or vomiting subsides. Call your  physician if vomiting continues.  Special Instructions/Symptoms: Your throat may feel dry or sore from the anesthesia or the breathing tube placed in your throat during surgery. If this causes discomfort, gargle with warm salt water. The discomfort should disappear within 24 hours.  If you had a scopolamine patch placed behind your ear for the management of post- operative nausea and/or vomiting:  1. The medication in the patch is effective for 72 hours, after which it should be removed.  Wrap patch in a tissue and discard in the trash. Wash hands thoroughly with soap and water. 2. You may remove the patch earlier than 72 hours if you experience unpleasant side effects which may include dry mouth, dizziness or visual disturbances. 3. Avoid touching the patch. Wash your hands with soap and water after contact with the patch.   Call your surgeon if you experience:   1.  Fever over 101.0. 2.  Inability to urinate. 3.  Nausea and/or vomiting. 4.  Extreme swelling or bruising at the surgical site. 5.  Continued bleeding from the incision. 6.  Increased pain, redness or drainage from the incision. 7.  Problems related to your pain medication. 8. Any change in color, movement and/or sensation 9. Any problems and/or concerns

## 2015-04-08 ENCOUNTER — Encounter (HOSPITAL_BASED_OUTPATIENT_CLINIC_OR_DEPARTMENT_OTHER): Payer: Self-pay | Admitting: General Surgery

## 2015-04-08 DIAGNOSIS — K8010 Calculus of gallbladder with chronic cholecystitis without obstruction: Secondary | ICD-10-CM | POA: Diagnosis not present

## 2015-04-08 MED ORDER — BUPIVACAINE HCL (PF) 0.25 % IJ SOLN
INTRAMUSCULAR | Status: AC
  Filled 2015-04-08: qty 180

## 2015-04-08 MED ORDER — LIDOCAINE HCL (PF) 1 % IJ SOLN
INTRAMUSCULAR | Status: AC
  Filled 2015-04-08: qty 90

## 2015-04-08 MED ORDER — METHYLPREDNISOLONE ACETATE 80 MG/ML IJ SUSP
INTRAMUSCULAR | Status: AC
  Filled 2015-04-08: qty 3

## 2015-04-08 MED ORDER — BUPIVACAINE HCL (PF) 0.5 % IJ SOLN
INTRAMUSCULAR | Status: AC
  Filled 2015-04-08: qty 180

## 2015-04-08 MED ORDER — SODIUM BICARBONATE 4 % IV SOLN
INTRAVENOUS | Status: AC
  Filled 2015-04-08: qty 30

## 2015-04-08 MED ORDER — METHYLPREDNISOLONE ACETATE 40 MG/ML IJ SUSP
INTRAMUSCULAR | Status: AC
  Filled 2015-04-08: qty 3

## 2015-04-08 NOTE — Progress Notes (Signed)
GS Progress Note Subjective: Patient awake and alert.  Having minimal abdominal discomfort.  Not much of an appetite.  Objective: Vital signs in last 24 hours: Temp:  [97.6 F (36.4 C)-98 F (36.7 C)] 98 F (36.7 C) (04/22 0230) Pulse Rate:  [65-84] 76 (04/22 0630) Resp:  [16-24] 18 (04/22 0630) BP: (130-168)/(62-86) 160/80 mmHg (04/22 0230) SpO2:  [89 %-100 %] 98 % (04/22 0630) Weight:  [94.348 kg (208 lb)] 94.348 kg (208 lb) (04/21 0730)    Intake/Output from previous day: 04/21 0701 - 04/22 0700 In: 2572 [P.O.:422; I.V.:2150] Out: 2250 [Urine:2250] Intake/Output this shift:    Lungs: Clear  Abd: Good bowel sounds.  Incision sites are clean and dry.  Extremities: No clinical signs or symptoms of DVT  Neuro: Intact  Lab Results: CBC  No results for input(s): WBC, HGB, HCT, PLT in the last 72 hours. BMET No results for input(s): NA, K, CL, CO2, GLUCOSE, BUN, CREATININE, CALCIUM in the last 72 hours. PT/INR No results for input(s): LABPROT, INR in the last 72 hours. ABG No results for input(s): PHART, HCO3 in the last 72 hours.  Invalid input(s): PCO2, PO2  Studies/Results: Dg C-arm 61-120 Min-no Report  04/07/2015   CLINICAL DATA: surgery   C-ARM 61-120 MINUTES  Fluoroscopy was utilized by the requesting physician.  No radiographic  interpretation.     Anti-infectives: Anti-infectives    Start     Dose/Rate Route Frequency Ordered Stop   04/07/15 0723  cefOXitin (MEFOXIN) 2 g in dextrose 5 % 50 mL IVPB     2 g 100 mL/hr over 30 Minutes Intravenous On call to O.R. 04/07/15 0723 04/07/15 1121      Assessment/Plan: s/p Procedure(s): LAPAROSCOPIC CHOLECYSTECTOMY WITH INTRAOPERATIVE CHOLANGIOGRAM Discharge     Marta LamasJames O. Gae BonWyatt, III, MD, FACS [phone]--pager [phone]--[facility] 04/08/2015

## 2018-07-16 ENCOUNTER — Other Ambulatory Visit: Payer: Self-pay

## 2018-07-16 ENCOUNTER — Emergency Department (HOSPITAL_BASED_OUTPATIENT_CLINIC_OR_DEPARTMENT_OTHER)
Admission: EM | Admit: 2018-07-16 | Discharge: 2018-07-16 | Disposition: A | Discharge status: Home / Self Care | Payer: BLUE CROSS/BLUE SHIELD | Attending: Emergency Medicine | Admitting: Emergency Medicine

## 2018-07-16 ENCOUNTER — Emergency Department (HOSPITAL_BASED_OUTPATIENT_CLINIC_OR_DEPARTMENT_OTHER): Payer: BLUE CROSS/BLUE SHIELD

## 2018-07-16 ENCOUNTER — Encounter (HOSPITAL_BASED_OUTPATIENT_CLINIC_OR_DEPARTMENT_OTHER): Payer: Self-pay

## 2018-07-16 DIAGNOSIS — I10 Essential (primary) hypertension: Secondary | ICD-10-CM | POA: Diagnosis not present

## 2018-07-16 DIAGNOSIS — Z79899 Other long term (current) drug therapy: Secondary | ICD-10-CM | POA: Insufficient documentation

## 2018-07-16 DIAGNOSIS — R079 Chest pain, unspecified: Secondary | ICD-10-CM | POA: Insufficient documentation

## 2018-07-16 DIAGNOSIS — E785 Hyperlipidemia, unspecified: Secondary | ICD-10-CM | POA: Diagnosis not present

## 2018-07-16 LAB — TROPONIN I: Troponin I: <0.03 ng/mL (ref <0.03)

## 2018-07-16 LAB — BASIC METABOLIC PANEL
Anion gap: 8 (ref 5–15)
BUN: 13 mg/dL (ref 8–23)
CALCIUM: 9 mg/dL (ref 8.9–10.3)
CO2: 31 mmol/L (ref 22–32)
Chloride: 98 mmol/L (ref 98–111)
Creatinine, Ser: 1.34 mg/dL — ABNORMAL HIGH (ref 0.44–1.00)
GFR calc Af Amer: 47 mL/min — ABNORMAL LOW (ref >60)
GFR calc non Af Amer: 40 mL/min — ABNORMAL LOW (ref >60)
Glucose, Bld: 158 mg/dL — ABNORMAL HIGH (ref 70–99)
Potassium: 3.8 mmol/L (ref 3.5–5.1)
Sodium: 137 mmol/L (ref 135–145)

## 2018-07-16 LAB — CBC
HCT: 40.6 % (ref 36.0–46.0)
Hemoglobin: 13.8 g/dL (ref 12.0–15.0)
MCH: 32.3 pg (ref 26.0–34.0)
MCHC: 34 g/dL (ref 30.0–36.0)
MCV: 95.1 fL (ref 78.0–100.0)
Platelets: 361 10*3/uL (ref 150–400)
RBC: 4.27 MIL/uL (ref 3.87–5.11)
RDW: 13.7 % (ref 11.5–15.5)
WBC: 7.5 10*3/uL (ref 4.0–10.5)

## 2018-07-16 MED ORDER — ASPIRIN 81 MG PO CHEW
324.0000 mg | CHEWABLE_TABLET | Freq: Once | ORAL | Status: AC
  Administered 2018-07-16: 324 mg via ORAL
  Filled 2018-07-16: qty 4

## 2018-07-16 MED ORDER — GI COCKTAIL ~~LOC~~
30.0000 mL | Freq: Once | ORAL | Status: AC
  Administered 2018-07-16: 30 mL via ORAL
  Filled 2018-07-16: qty 30

## 2018-07-16 MED ORDER — PANTOPRAZOLE SODIUM 20 MG PO TBEC: 20.0000 mg | DELAYED_RELEASE_TABLET | Freq: Every day | ORAL | 0 refills | Status: DC

## 2018-07-16 NOTE — ED Provider Notes (Signed)
[facility] EMERGENCY DEPARTMENT Provider Note   CSN: [id number] Arrival date & time: 07/16/18  2042     History   Chief Complaint Chief Complaint  Patient presents with  . Chest Pain    HPI Judy Castro is a 67 y.o. female.  She presents to the emergency department with complaint of chest pain.  She states she ate fried chicken and vegetables around 6 PM then about 630 she started experiencing sharp stabbing substernal chest pain.  She states it would come in waves from 5 out of 10 to a 10 out of 10.  She has not experienced any of the high waves since she got here but she still rates the pain is 5 out of 10.  She had similar symptoms when she had her gallbladder attack although that also radiated into her back.  She is had her gallbladder out since then.  She states she has no known cardiac disease but has had a Holter monitor and a stress test in the past that did not find any problems.  She has a history of high blood pressure.  It was not associated with any shortness of breath or diaphoresis or dizziness.  It was associated with some nausea and she tried to vomit but was unsuccessful.  The history is provided by the patient.  Chest Pain   This is a new problem. The current episode started 3 to 5 hours ago. The problem occurs constantly. The problem has been gradually improving. The pain is associated with eating. The pain is present in the substernal region. The pain is at a severity of 10/10. The quality of the pain is described as sharp and stabbing. The pain does not radiate. Associated symptoms include nausea. Pertinent negatives include no abdominal pain, no back pain, no cough, no diaphoresis, no dizziness, no fever, no headaches, no hemoptysis, no numbness, no palpitations, no shortness of breath, no sputum production, no syncope and no vomiting. She has tried nothing for the symptoms. The treatment provided no relief.  Her past medical history is significant for  hyperlipidemia and hypertension.    Past Medical History:  Diagnosis Date  . Hyperlipemia   . Hypertension   . Wears glasses     Patient Active Problem List   Diagnosis Date Noted  . S/P cholecystectomy 04/07/2015    Past Surgical History:  Procedure Laterality Date  . ABDOMINAL HYSTERECTOMY     partial  . CHOLECYSTECTOMY N/A 04/07/2015   Procedure: LAPAROSCOPIC CHOLECYSTECTOMY WITH INTRAOPERATIVE CHOLANGIOGRAM;  Surgeon: Jimmye Norman, MD;  Location: [facility];  Service: General;  Laterality: N/A;  . COLONOSCOPY       OB History   None      Home Medications    Prior to Admission medications   Medication Sig Start Date End Date Taking? Authorizing Provider  atenolol (TENORMIN) 25 MG tablet Take by mouth daily.    [provider]  estradiol (ESTRACE) 1 MG tablet Take 1 mg by mouth daily.    [provider]  lisinopril (PRINIVIL,ZESTRIL) 20 MG tablet Take 20 mg by mouth daily.    [provider]  oxyCODONE-acetaminophen (PERCOCET) 10-325 MG per tablet Take 1 tablet by mouth every 4 (four) hours as needed for pain. 04/07/15   Jimmye Norman, MD  Vitamin D, Ergocalciferol, (DRISDOL) 50000 UNITS CAPS capsule Take 50,000 Units by mouth every 7 (seven) days.    [provider]    Family History No family history on file.  Social History Social History   Tobacco Use  . Smoking status: Never Smoker  . Smokeless tobacco: Never Used  Substance Use Topics  . Alcohol use: No  . Drug use: No     Allergies   Patient has no known allergies.   Review of Systems Review of Systems  Constitutional: Negative for diaphoresis and fever.  HENT: Negative for sore throat.   Eyes: Negative for visual disturbance.  Respiratory: Negative for cough, hemoptysis, sputum production and shortness of breath.   Cardiovascular: Positive for chest pain. Negative for palpitations and syncope.  Gastrointestinal: Positive for nausea. Negative  for abdominal pain and vomiting.  Genitourinary: Negative for dysuria.  Musculoskeletal: Negative for back pain.  Skin: Negative for rash.  Neurological: Negative for dizziness, numbness and headaches.     Physical Exam Updated Vital Signs BP (!) 181/84 (BP Location: Right Arm)   Pulse 74   Temp 98.3 F (36.8 C) (Oral)   Resp 20   Ht 5\' 3"  (1.6 m)   Wt 102.1 kg (225 lb)   SpO2 99%   BMI 39.86 kg/m   Physical Exam  Constitutional: She appears well-developed and well-nourished. No distress.  HENT:  Head: Normocephalic and atraumatic.  Eyes: Conjunctivae are normal.  Neck: Neck supple.  Cardiovascular: Normal rate, regular rhythm and normal pulses.  No murmur heard. Pulmonary/Chest: Effort normal and breath sounds normal. No respiratory distress.  Abdominal: Soft. There is no tenderness.  Musculoskeletal: Normal range of motion. She exhibits no edema.       Right lower leg: She exhibits no tenderness.       Left lower leg: She exhibits no tenderness.  Neurological: She is alert.  Skin: Skin is warm and dry. Capillary refill takes less than 2 seconds.  Psychiatric: She has a normal mood and affect.  Nursing note and vitals reviewed.    ED Treatments / Results  Labs (all labs ordered are listed, but only abnormal results are displayed) Labs Reviewed  BASIC METABOLIC PANEL - Abnormal; Notable for the following components:      Result Value   Glucose, Bld 158 (*)    Creatinine, Ser 1.34 (*)    GFR calc non Af Amer 40 (*)    GFR calc Af Amer 47 (*)    All other components within normal limits  CBC  TROPONIN I  TROPONIN I    EKG EKG Interpretation  Date/Time:  Wednesday July 16 2018 20:59:45 EDT Ventricular Rate:  74 PR Interval:    QRS Duration: 85 QT Interval:  397 QTC Calculation: 441 R Axis:   27 Text Interpretation:  Sinus rhythm Borderline T abnormalities, lateral leads Baseline wander in lead(s) I V6 similar to prior 4/16 Confirmed by Meridee ScoreButler, Alexea Blase  [phone]([id number]) on 07/16/2018 9:27:39 PM Also confirmed by Meridee ScoreButler, Emeka Lindner [phone]), editor Barbette Hairassel, Kerry [phone]([id number])  on 07/17/2018 7:31:23 AM   Radiology Dg Chest 2 View  Result Date: 07/16/2018 CLINICAL DATA:  Chest pain EXAM: CHEST - 2 VIEW COMPARISON:  04/04/2015 FINDINGS: Borderline cardiomegaly. No acute airspace disease or effusion. Normal heart size. No pneumothorax. IMPRESSION: No active cardiopulmonary disease. Electronically Signed   By: Jasmine PangKim  Fujinaga M.D.   On: 07/16/2018 21:26    Procedures Procedures (including critical care time)  Medications Ordered in ED Medications  aspirin chewable tablet 324 mg (has no administration in time range)  gi cocktail (Maalox,Lidocaine,Donnatal) (has no administration in time range)     Initial Impression / Assessment and Plan / ED Course  I  have reviewed the triage vital signs and the nursing notes.  Pertinent labs & imaging results that were available during my care of the patient were reviewed by me and considered in my medical decision making (see chart for details).  Clinical Course as of Jul 18 1703  Wed Jul 16, 2018  [id number] 67 year old female with hypertension hyperlipidemia but no prior cardiac history here with substernal chest pain occurring after eating fried chicken.  She does not have her gallbladder.  Her initial EKG is nonspecific.  We are giving her aspirin and will try GI cocktail.   [MB]  2234 Patient's initial work-up including troponin were unremarkable other than a slight bump in her creatinine to 1.34.  Patient states her primary care doctor is been watching that.  She got a GI cocktail that gave her moderate relief of her symptoms.  I told her this is reassuring but was to go to get a second troponin if that is negative she likely can be discharged on a PPI and follow-up with her doctor.  She is comfortable with the plan.   [MB]    Clinical Course User Index [MB] Terrilee Files, MD     Final Clinical Impressions(s) / ED  Diagnoses   Final diagnoses:  Nonspecific chest pain    ED Discharge Orders        Ordered    pantoprazole (PROTONIX) 20 MG tablet  Daily     07/16/18 2306       Terrilee Files, MD 07/17/18 1704

## 2018-07-16 NOTE — ED Notes (Signed)
Pt ambulated to restroom. Tolerated well.

## 2018-07-16 NOTE — ED Notes (Signed)
Patient transported to X-ray 

## 2018-07-16 NOTE — ED Notes (Signed)
Pt verbalizes understanding of d/c instructions and denies any further needs at this time. 

## 2018-07-16 NOTE — ED Triage Notes (Signed)
C/o central CP x 3 hours-to triage in w/c-ambulated into ED lobby-NAD

## 2018-07-16 NOTE — Discharge Instructions (Signed)
Your evaluated in the emergency department for chest pain that occurred tonight after eating.  You had blood work chest x-ray and EKG did not show any obvious signs of heart injury.  We are prescribing you acid medication to take.  He will be important for you to follow-up with your regular doctor for further cardiac evaluation.  Please return if any worsening symptoms.

## 2020-10-27 ENCOUNTER — Encounter (HOSPITAL_BASED_OUTPATIENT_CLINIC_OR_DEPARTMENT_OTHER): Payer: Self-pay | Admitting: Emergency Medicine

## 2020-10-27 ENCOUNTER — Emergency Department (HOSPITAL_BASED_OUTPATIENT_CLINIC_OR_DEPARTMENT_OTHER): Payer: Medicare HMO

## 2020-10-27 ENCOUNTER — Emergency Department (HOSPITAL_BASED_OUTPATIENT_CLINIC_OR_DEPARTMENT_OTHER)
Admission: EM | Admit: 2020-10-27 | Discharge: 2020-10-27 | Disposition: A | Discharge status: Home / Self Care | Payer: Medicare HMO | Attending: Emergency Medicine | Admitting: Emergency Medicine

## 2020-10-27 ENCOUNTER — Other Ambulatory Visit: Payer: Self-pay

## 2020-10-27 ENCOUNTER — Other Ambulatory Visit (HOSPITAL_BASED_OUTPATIENT_CLINIC_OR_DEPARTMENT_OTHER): Payer: Self-pay | Admitting: Emergency Medicine

## 2020-10-27 DIAGNOSIS — I10 Essential (primary) hypertension: Secondary | ICD-10-CM | POA: Insufficient documentation

## 2020-10-27 DIAGNOSIS — K219 Gastro-esophageal reflux disease without esophagitis: Secondary | ICD-10-CM | POA: Insufficient documentation

## 2020-10-27 DIAGNOSIS — Z79899 Other long term (current) drug therapy: Secondary | ICD-10-CM | POA: Diagnosis not present

## 2020-10-27 DIAGNOSIS — R1013 Epigastric pain: Secondary | ICD-10-CM | POA: Diagnosis present

## 2020-10-27 DIAGNOSIS — R110 Nausea: Secondary | ICD-10-CM | POA: Insufficient documentation

## 2020-10-27 LAB — COMPREHENSIVE METABOLIC PANEL
ALT: 27 U/L (ref 0–44)
AST: 56 U/L — ABNORMAL HIGH (ref 15–41)
Albumin: 3.8 g/dL (ref 3.5–5.0)
Alkaline Phosphatase: 53 U/L (ref 38–126)
Anion gap: 10 (ref 5–15)
BUN: 11 mg/dL (ref 8–23)
CO2: 27 mmol/L (ref 22–32)
Calcium: 8.7 mg/dL — ABNORMAL LOW (ref 8.9–10.3)
Chloride: 101 mmol/L (ref 98–111)
Creatinine, Ser: 0.94 mg/dL (ref 0.44–1.00)
GFR, Estimated: >60 mL/min (ref >60)
Glucose, Bld: 145 mg/dL — ABNORMAL HIGH (ref 70–99)
Potassium: 4 mmol/L (ref 3.5–5.1)
Sodium: 138 mmol/L (ref 135–145)
Total Bilirubin: 0.7 mg/dL (ref 0.3–1.2)
Total Protein: 6.8 g/dL (ref 6.5–8.1)

## 2020-10-27 LAB — CBC WITH DIFFERENTIAL/PLATELET
Abs Immature Granulocytes: 0.02 10*3/uL (ref 0.00–0.07)
Basophils Absolute: 0.1 10*3/uL (ref 0.0–0.1)
Basophils Relative: 1 %
Eosinophils Absolute: 0.2 10*3/uL (ref 0.0–0.5)
Eosinophils Relative: 3 %
HCT: 39.9 % (ref 36.0–46.0)
Hemoglobin: 13.7 g/dL (ref 12.0–15.0)
Immature Granulocytes: 0 %
Lymphocytes Relative: 17 %
Lymphs Abs: 1.4 10*3/uL (ref 0.7–4.0)
MCH: 32.5 pg (ref 26.0–34.0)
MCHC: 34.3 g/dL (ref 30.0–36.0)
MCV: 94.5 fL (ref 80.0–100.0)
Monocytes Absolute: 0.8 10*3/uL (ref 0.1–1.0)
Monocytes Relative: 10 %
Neutro Abs: 5.4 10*3/uL (ref 1.7–7.7)
Neutrophils Relative %: 69 %
Platelets: 341 10*3/uL (ref 150–400)
RBC: 4.22 MIL/uL (ref 3.87–5.11)
RDW: 13.2 % (ref 11.5–15.5)
WBC: 7.8 10*3/uL (ref 4.0–10.5)
nRBC: 0 % (ref 0.0–0.2)

## 2020-10-27 LAB — TROPONIN I (HIGH SENSITIVITY)
Troponin I (High Sensitivity): 4 ng/L (ref <18)
Troponin I (High Sensitivity): 4 ng/L (ref <18)

## 2020-10-27 LAB — LIPASE, BLOOD: Lipase: 22 U/L (ref 11–51)

## 2020-10-27 MED ORDER — FENTANYL CITRATE (PF) 100 MCG/2ML IJ SOLN
50.0000 ug | Freq: Once | INTRAMUSCULAR | Status: AC
  Administered 2020-10-27: 50 ug via INTRAVENOUS
  Filled 2020-10-27: qty 2

## 2020-10-27 MED ORDER — ALUM & MAG HYDROXIDE-SIMETH 200-200-20 MG/5ML PO SUSP
30.0000 mL | Freq: Once | ORAL | Status: AC
  Administered 2020-10-27: 30 mL via ORAL
  Filled 2020-10-27: qty 30

## 2020-10-27 MED ORDER — SUCRALFATE 1 G PO TABS: 1.0000 g | ORAL_TABLET | Freq: Three times a day (TID) | ORAL | 0 refills | Status: DC

## 2020-10-27 MED ORDER — LIDOCAINE VISCOUS HCL 2 % MT SOLN
15.0000 mL | Freq: Once | OROMUCOSAL | Status: AC
  Administered 2020-10-27: 15 mL via ORAL
  Filled 2020-10-27: qty 15

## 2020-10-27 MED ORDER — PANTOPRAZOLE SODIUM 20 MG PO TBEC: 20.0000 mg | DELAYED_RELEASE_TABLET | Freq: Every day | ORAL | 0 refills | Status: DC

## 2020-10-27 MED ORDER — IOHEXOL 300 MG/ML  SOLN
100.0000 mL | Freq: Once | INTRAMUSCULAR | Status: AC | PRN
  Administered 2020-10-27: 100 mL via INTRAVENOUS

## 2020-10-27 MED ORDER — PANTOPRAZOLE SODIUM 40 MG IV SOLR
40.0000 mg | Freq: Once | INTRAVENOUS | Status: AC
  Administered 2020-10-27: 40 mg via INTRAVENOUS
  Filled 2020-10-27: qty 40

## 2020-10-27 MED FILL — SUCRALFATE 1 GM TABLET: 1 | 7 days supply | Qty: 30 | Fill #0

## 2020-10-27 MED FILL — PANTOPRAZOLE SOD DR 20 MG T: 20 | 30 days supply | Qty: 30 | Fill #0

## 2020-10-27 NOTE — ED Provider Notes (Signed)
[facility] EMERGENCY DEPARTMENT Provider Note   CSN: [id number] Arrival date & time: 10/27/20  1032     History Chief Complaint  Patient presents with  . Abdominal Pain    epigastric    Judy Castro is a 69 y.o. female.  Patient is a 69 year old female with a history of hypertension and hyperlipidemia who presents with epigastric pain.  She states that she ate some [place] food last night and it was kind of late.  This morning she had onset of pain in her epigastrium that radiates through to her back.  She said it was associated with some nausea and belching.  There is no associated chest pain.  She does not really report any shortness of breath although she said when the pain occurred it kind of took her breath away and she did feel like she got a little sweaty with it.  Right now she is having some aching in her epigastric area and some mild nausea.  She has had some history of reflux in the past.  This morning she took some vinegar and says it did ease off the pain a little bit.  She is status post cholecystectomy.        Past Medical History:  Diagnosis Date  . Hyperlipemia   . Hypertension   . Wears glasses     Patient Active Problem List   Diagnosis Date Noted  . S/P cholecystectomy 04/07/2015    Past Surgical History:  Procedure Laterality Date  . ABDOMINAL HYSTERECTOMY     partial  . CHOLECYSTECTOMY N/A 04/07/2015   Procedure: LAPAROSCOPIC CHOLECYSTECTOMY WITH INTRAOPERATIVE CHOLANGIOGRAM;  Surgeon: Jimmye Norman, MD;  Location: [facility];  Service: General;  Laterality: N/A;  . COLONOSCOPY       OB History   No obstetric history on file.     No family history on file.  Social History   Tobacco Use  . Smoking status: Never Smoker  . Smokeless tobacco: Never Used  Substance Use Topics  . Alcohol use: No  . Drug use: No    Home Medications Prior to Admission medications   Medication Sig Start Date End Date Taking?  Authorizing Provider  atenolol (TENORMIN) 25 MG tablet Take by mouth daily.    [provider]  estradiol (ESTRACE) 1 MG tablet Take 1 mg by mouth daily.    [provider]  lisinopril (PRINIVIL,ZESTRIL) 20 MG tablet Take 20 mg by mouth daily.    [provider]  oxyCODONE-acetaminophen (PERCOCET) 10-325 MG per tablet Take 1 tablet by mouth every 4 (four) hours as needed for pain. 04/07/15   Jimmye Norman, MD  pantoprazole (PROTONIX) 20 MG tablet Take 1 tablet (20 mg total) by mouth daily. 10/27/20   Rolan Bucco, MD  sucralfate (CARAFATE) 1 g tablet Take 1 tablet (1 g total) by mouth 4 (four) times daily -  with meals and at bedtime. 10/27/20   Rolan Bucco, MD  Vitamin D, Ergocalciferol, (DRISDOL) 50000 UNITS CAPS capsule Take 50,000 Units by mouth every 7 (seven) days.    [provider]    Allergies    Patient has no known allergies.  Review of Systems   Review of Systems  Constitutional: Positive for diaphoresis. Negative for chills, fatigue and fever.  HENT: Negative for congestion, rhinorrhea and sneezing.   Eyes: Negative.   Respiratory: Negative for cough, chest tightness and shortness of breath.   Cardiovascular: Negative for chest pain and leg swelling.  Gastrointestinal:  Positive for abdominal pain (Epigastric pain) and nausea. Negative for blood in stool, diarrhea and vomiting.  Genitourinary: Negative for difficulty urinating, flank pain, frequency and hematuria.  Musculoskeletal: Negative for arthralgias and back pain.  Skin: Negative for rash.  Neurological: Negative for dizziness, speech difficulty, weakness, numbness and headaches.    Physical Exam Updated Vital Signs BP (!) 191/101 (BP Location: Right Arm)   Pulse 64   Temp 98 F (36.7 C) (Oral)   Resp 18   SpO2 98%   Physical Exam Constitutional:      Appearance: She is well-developed.  HENT:     Head: Normocephalic and atraumatic.  Eyes:     Pupils: Pupils are equal,  round, and reactive to light.  Cardiovascular:     Rate and Rhythm: Normal rate and regular rhythm.     Heart sounds: Normal heart sounds.  Pulmonary:     Effort: Pulmonary effort is normal. No respiratory distress.     Breath sounds: Normal breath sounds. No wheezing or rales.  Chest:     Chest wall: No tenderness.  Abdominal:     General: Bowel sounds are normal.     Palpations: Abdomen is soft.     Tenderness: There is abdominal tenderness in the epigastric area. There is no guarding or rebound.  Musculoskeletal:        General: Normal range of motion.     Cervical back: Normal range of motion and neck supple.  Lymphadenopathy:     Cervical: No cervical adenopathy.  Skin:    General: Skin is warm and dry.     Findings: No rash.  Neurological:     Mental Status: She is alert and oriented to person, place, and time.     ED Results / Procedures / Treatments   Labs (all labs ordered are listed, but only abnormal results are displayed) Labs Reviewed  COMPREHENSIVE METABOLIC PANEL - Abnormal; Notable for the following components:      Result Value   Glucose, Bld 145 (*)    Calcium 8.7 (*)    AST 56 (*)    All other components within normal limits  LIPASE, BLOOD  CBC WITH DIFFERENTIAL/PLATELET  TROPONIN I (HIGH SENSITIVITY)  TROPONIN I (HIGH SENSITIVITY)    EKG EKG Interpretation  Date/Time:  Thursday October 27 2020 10:45:53 EST Ventricular Rate:  68 PR Interval:    QRS Duration: 87 QT Interval:  425 QTC Calculation: 452 R Axis:   11 Text Interpretation: Sinus rhythm since last tracing no significant change Confirmed by Rolan Bucco [phone]) on 10/27/2020 10:48:55 AM   Radiology CT Abdomen Pelvis W Contrast  Result Date: 10/27/2020 CLINICAL DATA:  Acute epigastric abdominal pain. EXAM: CT ABDOMEN AND PELVIS WITH CONTRAST TECHNIQUE: Multidetector CT imaging of the abdomen and pelvis was performed using the standard protocol following bolus administration of  intravenous contrast. CONTRAST:  OMNIPAQUE IOHEXOL 300 MG/ML  SOLN COMPARISON:  November 27, 2010. FINDINGS: Lower chest: No acute abnormality. Hepatobiliary: No focal liver abnormality is seen. Status post cholecystectomy. No biliary dilatation. Pancreas: Unremarkable. No pancreatic ductal dilatation or surrounding inflammatory changes. Spleen: Normal in size without focal abnormality. Adrenals/Urinary Tract: Adrenal glands are unremarkable. Kidneys are normal, without renal calculi, focal lesion, or hydronephrosis. Bladder is unremarkable. Stomach/Bowel: The stomach appears normal. There is no evidence of bowel obstruction or inflammation. Sigmoid diverticulosis is noted without inflammation. The appendix is not visualized, but no inflammation is noted in the right lower quadrant. Vascular/Lymphatic: Aortic atherosclerosis. No enlarged  abdominal or pelvic lymph nodes. Reproductive: Status post hysterectomy. No adnexal masses. Other: Small fat containing periumbilical hernia is noted. No ascites is noted. Musculoskeletal: No acute or significant osseous findings. IMPRESSION: 1. Sigmoid diverticulosis without inflammation. 2. Small fat containing periumbilical hernia. 3. No acute abnormality seen in the abdomen or pelvis. 4. Aortic atherosclerosis. Aortic Atherosclerosis (ICD10-I70.0). Electronically Signed   By: Lupita Raider M.D.   On: 10/27/2020 13:10    Procedures Procedures (including critical care time)  Medications Ordered in ED Medications  alum & mag hydroxide-simeth (MAALOX/MYLANTA) 200-200-20 MG/5ML suspension 30 mL (30 mLs Oral Given 10/27/20 1108)    And  lidocaine (XYLOCAINE) 2 % viscous mouth solution 15 mL (15 mLs Oral Given 10/27/20 1108)  pantoprazole (PROTONIX) injection 40 mg (40 mg Intravenous Given 10/27/20 1120)  fentaNYL (SUBLIMAZE) injection 50 mcg (50 mcg Intravenous Given 10/27/20 1216)  iohexol (OMNIPAQUE) 300 MG/ML solution 100 mL (100 mLs Intravenous Contrast Given  10/27/20 1248)    ED Course  I have reviewed the triage vital signs and the nursing notes.  Pertinent labs & imaging results that were available during my care of the patient were reviewed by me and considered in my medical decision making (see chart for details).    MDM Rules/Calculators/A&P                          Patient is a 69 year old female who presents with epigastric pain.  It is fairly intense on presentation.  She had no radiation to her chest.  Some shortness of breath but seem to be more related to pain.  She did have some diaphoresis.  EKG does not show any ischemic changes.  Her troponins are negative.  I have a low suspicion for ACS.  She is tender in her epigastrium.  She is status post cholecystectomy.  Her labs show a minimally elevated liver enzyme but she says she has had this in the past.  Otherwise nonconcerning.  Her lipase was normal without suggestions of pancreatitis.  She was given a GI cocktail and did not really get much relief with that.  She then had a CT scan of her abdomen pelvis which showed a nonobstructive fat-containing small umbilical hernia but otherwise unremarkable.  I do not feel that this is the etiology for her pain.  She does not have any tenderness on palpation of this area.  She was discharged home in good condition.  Her pain has since improved with treatment in the ED.  She has no vomiting.  She was given a prescription for Protonix and Carafate.  She was encouraged to have follow-up with her PCP.  Her blood pressure can be rechecked at this time as well.  Return precautions were given.  She was also advised on a bland diet. Final Clinical Impression(s) / ED Diagnoses Final diagnoses:  Epigastric pain    Rx / DC Orders ED Discharge Orders         Ordered    pantoprazole (PROTONIX) 20 MG tablet  Daily        10/27/20 1458    sucralfate (CARAFATE) 1 g tablet  3 times daily with meals & bedtime        10/27/20 1458           Rolan Bucco, MD 10/27/20 1502

## 2020-10-27 NOTE — ED Triage Notes (Signed)
Epigastric pain today, nausea. Radiating to back .

## 2022-05-20 IMAGING — CT CT ABD-PELV W/ CM
2 of 8 series · 14 of 46 positions shown, 19 images · IV contrast (Omnipaque)
Comparison: November 27, 2010.

CLINICAL DATA: Acute epigastric abdominal pain.

EXAM:
CT ABDOMEN AND PELVIS WITH CONTRAST
TECHNIQUE: Multidetector CT imaging of the abdomen and pelvis was performed
using the standard protocol following bolus administration of
intravenous contrast.
CONTRAST:  100mL OMNIPAQUE IOHEXOL 300 MG/ML  SOLN

[Series 2: axial st · axial · 0.89mm/px · z∈[-465,-30]mm · 11 of 99 slices shown, 16 images]
[im 6/99  soft-tissue]
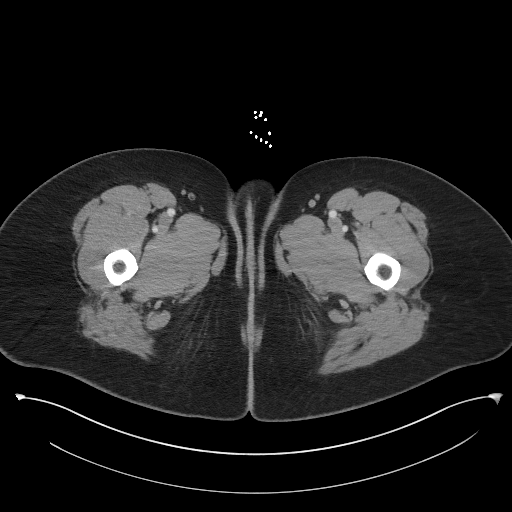
[im 6/99  bone]
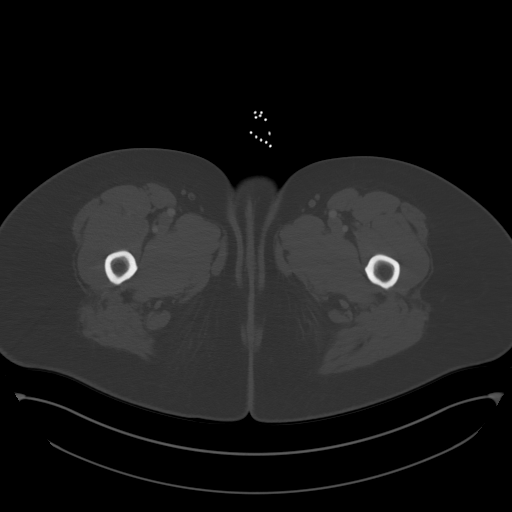
[im 18/99  soft-tissue]
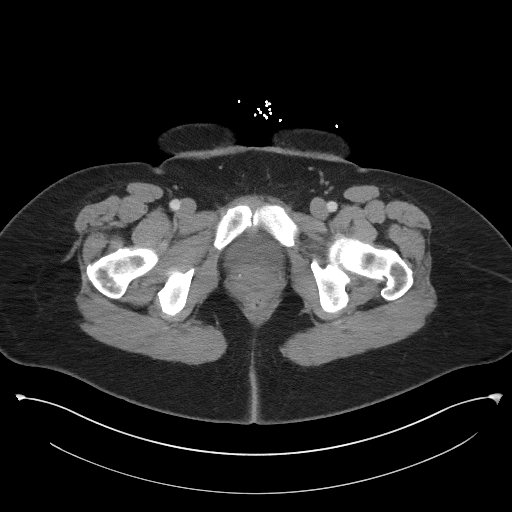
[im 29/99  soft-tissue]
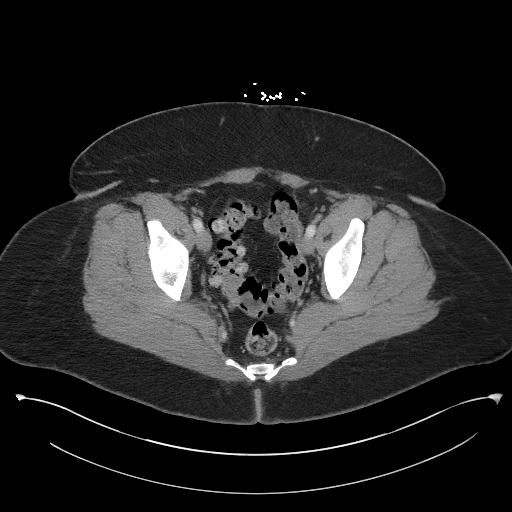
[im 35/99  soft-tissue]
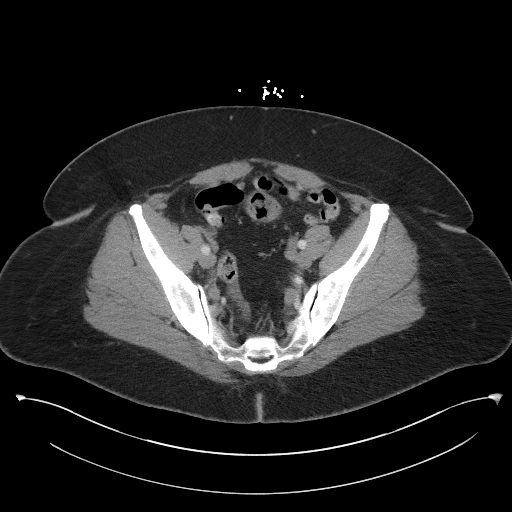
[im 47/99  soft-tissue]
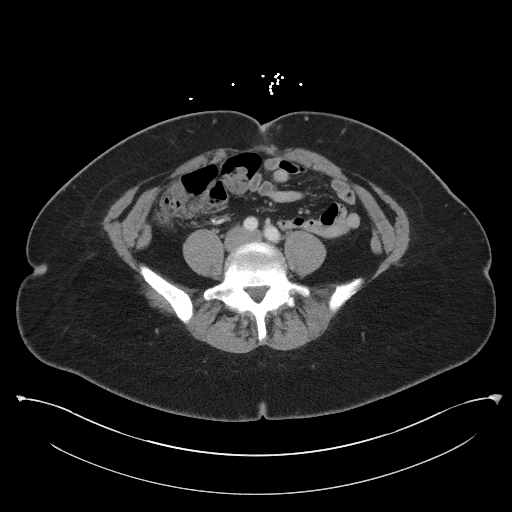
[im 52/99  soft-tissue]
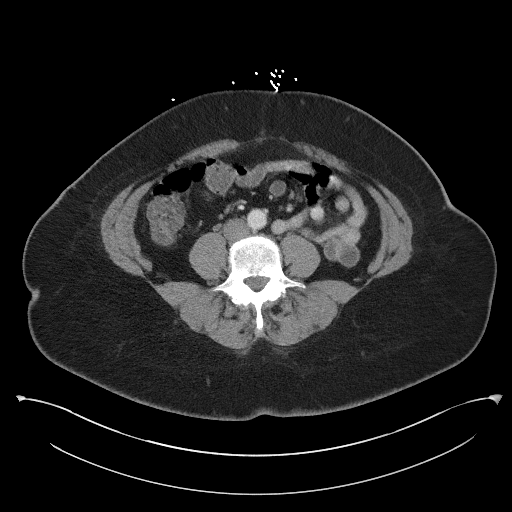
[im 64/99  soft-tissue]
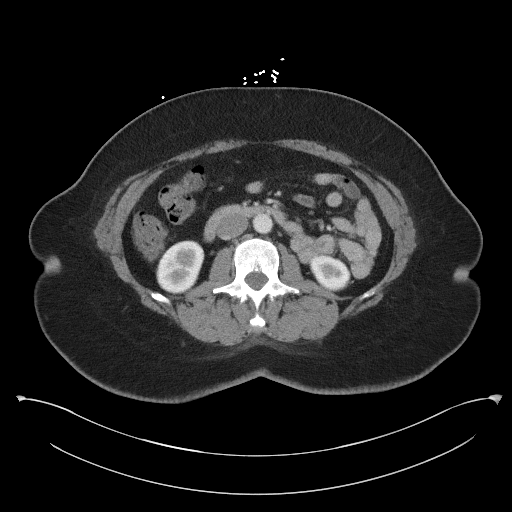
[im 75/99  soft-tissue]
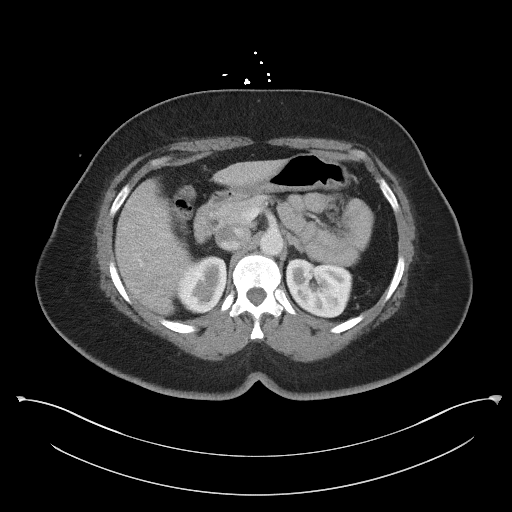
[im 75/99  lung]
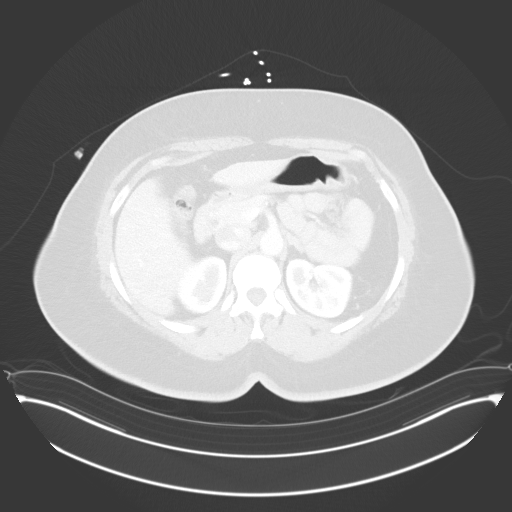
[im 81/99  soft-tissue]
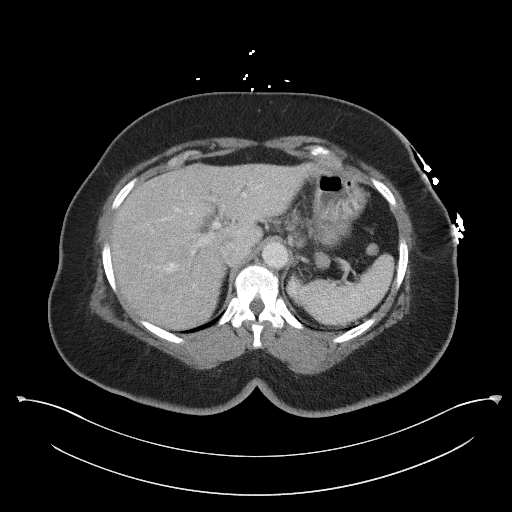
[im 81/99  lung]
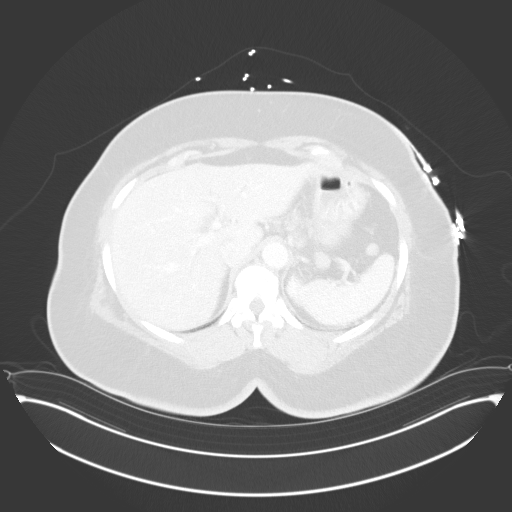
[im 81/99  bone]
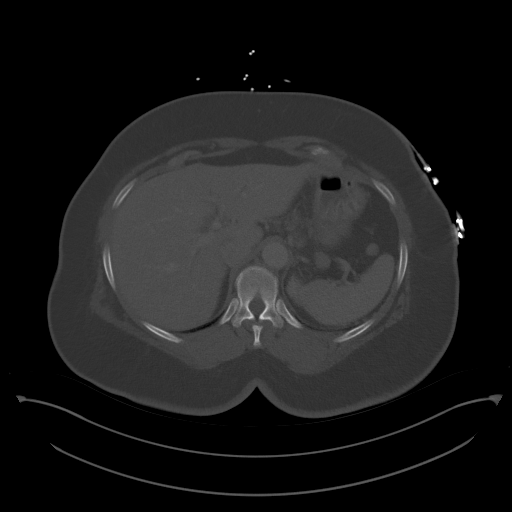
[im 87/99  lung]
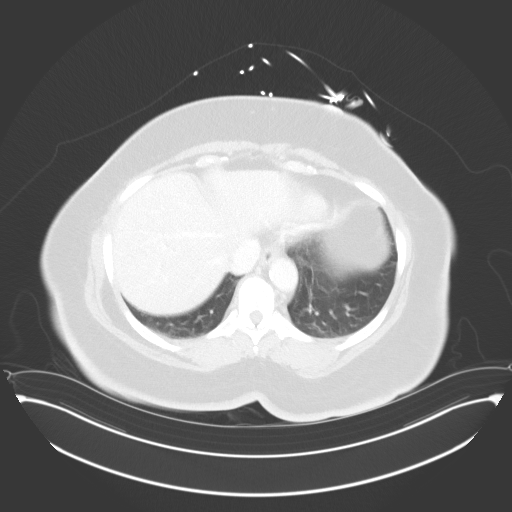
[im 93/99  soft-tissue]
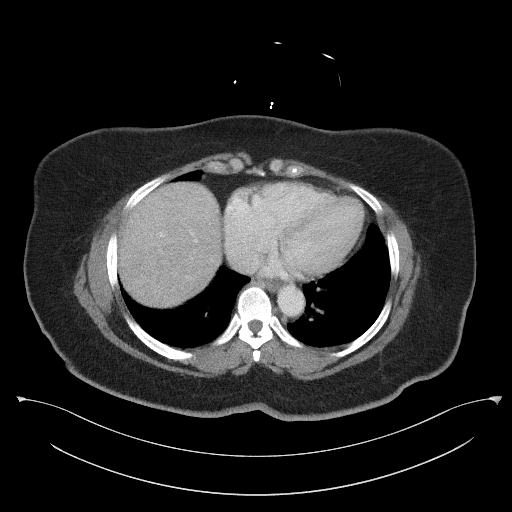
[im 93/99  lung]
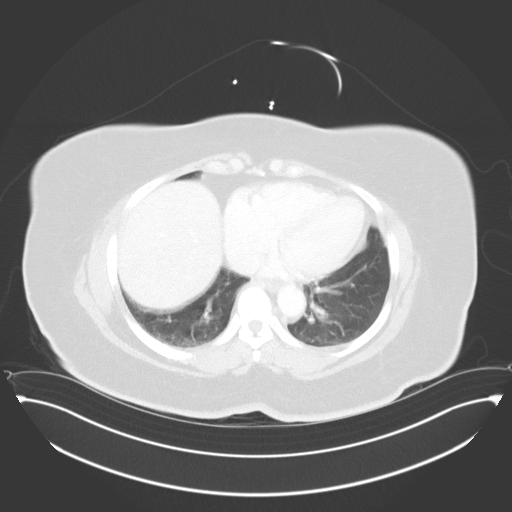

[Series 5: coronal st · coronal · 0.96mm/px · 3 of 99 slices shown]
[im 25/99  soft-tissue]
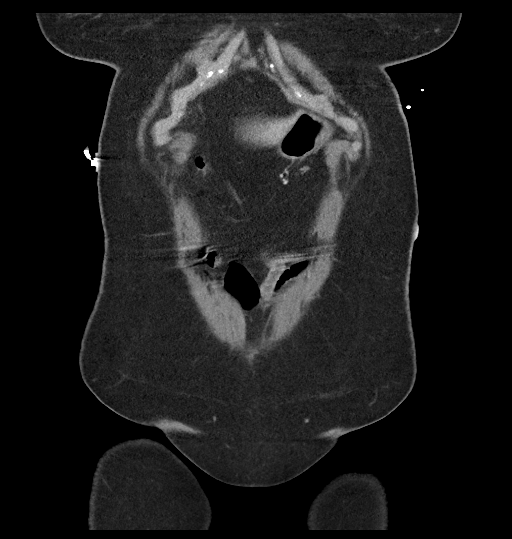
[im 50/99  soft-tissue]
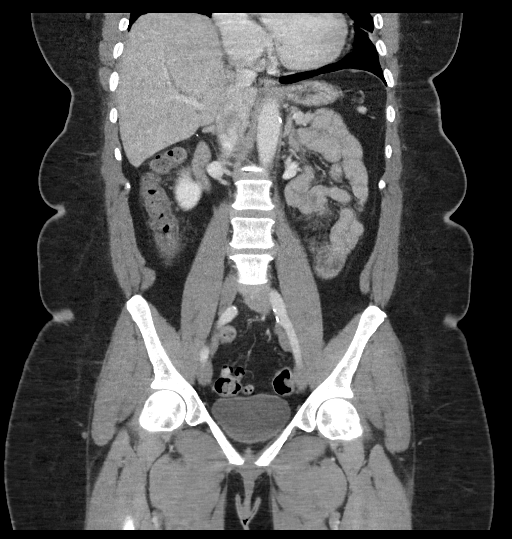
[im 74/99  soft-tissue]
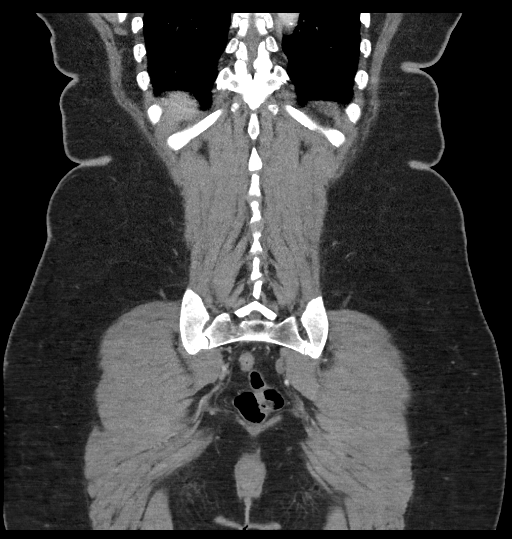

[14 of 46 positions shown; findings below may reference images not displayed]

FINDINGS: Lower chest: No acute abnormality.

Hepatobiliary: No focal liver abnormality is seen. Status post
cholecystectomy. No biliary dilatation.

Pancreas: Unremarkable. No pancreatic ductal dilatation or
surrounding inflammatory changes.

Spleen: Normal in size without focal abnormality.

Adrenals/Urinary Tract: Adrenal glands are unremarkable. Kidneys are
normal, without renal calculi, focal lesion, or hydronephrosis.
Bladder is unremarkable.

Stomach/Bowel: The stomach appears normal. There is no evidence of
bowel obstruction or inflammation. Sigmoid diverticulosis is noted
without inflammation. The appendix is not visualized, but no
inflammation is noted in the right lower quadrant.

Vascular/Lymphatic: Aortic atherosclerosis. No enlarged abdominal or
pelvic lymph nodes.

Reproductive: Status post hysterectomy. No adnexal masses.

Other: Small fat containing periumbilical hernia is noted. No
ascites is noted.

Musculoskeletal: No acute or significant osseous findings.
IMPRESSION: 1. Sigmoid diverticulosis without inflammation.
2. Small fat containing periumbilical hernia.
3. No acute abnormality seen in the abdomen or pelvis.
4. Aortic atherosclerosis.

Aortic Atherosclerosis (DDH2S-TA9.9).
